# Patient Record
Sex: Female | Born: 1960 | Race: White | Hispanic: No | Marital: Married | Smoking: Never smoker
Health system: Southern US, Community
[De-identification: ages and names within clinical notes are randomized; demographics above are authoritative.]

## PROBLEM LIST (undated history)

## (undated) DIAGNOSIS — G5600 Carpal tunnel syndrome, unspecified upper limb: Secondary | ICD-10-CM

## (undated) DIAGNOSIS — Z86010 Personal history of colonic polyps: Secondary | ICD-10-CM

## (undated) DIAGNOSIS — E039 Hypothyroidism, unspecified: Secondary | ICD-10-CM

## (undated) DIAGNOSIS — M2550 Pain in unspecified joint: Secondary | ICD-10-CM

## (undated) HISTORY — DX: Personal history of colonic polyps: Z86.010

## (undated) HISTORY — DX: Carpal tunnel syndrome, unspecified upper limb: G56.00

## (undated) HISTORY — DX: Pain in unspecified joint: M25.50

## (undated) HISTORY — DX: Hypothyroidism, unspecified: E03.9

## (undated) HISTORY — DX: Other disorders of bilirubin metabolism: E80.6

---

## 1998-05-06 ENCOUNTER — Other Ambulatory Visit: Admission: RE | Admit: 1998-05-06 | Discharge: 1998-05-06 | Payer: Self-pay | Admitting: Family Medicine

## 1998-05-21 ENCOUNTER — Emergency Department (HOSPITAL_COMMUNITY): Admission: EM | Admit: 1998-05-21 | Discharge: 1998-05-21 | Payer: Self-pay | Admitting: Emergency Medicine

## 1998-05-24 ENCOUNTER — Emergency Department (HOSPITAL_COMMUNITY): Admission: EM | Admit: 1998-05-24 | Discharge: 1998-05-24 | Payer: Self-pay | Admitting: *Deleted

## 1998-05-28 ENCOUNTER — Encounter (HOSPITAL_COMMUNITY): Admission: RE | Admit: 1998-05-28 | Discharge: 1998-08-26 | Payer: Self-pay | Admitting: *Deleted

## 1998-06-04 ENCOUNTER — Emergency Department (HOSPITAL_COMMUNITY): Admission: EM | Admit: 1998-06-04 | Discharge: 1998-06-04 | Payer: Self-pay | Admitting: Emergency Medicine

## 1999-05-12 ENCOUNTER — Other Ambulatory Visit: Admission: RE | Admit: 1999-05-12 | Discharge: 1999-05-12 | Payer: Self-pay | Admitting: Obstetrics and Gynecology

## 1999-09-02 ENCOUNTER — Ambulatory Visit (HOSPITAL_BASED_OUTPATIENT_CLINIC_OR_DEPARTMENT_OTHER): Admission: RE | Admit: 1999-09-02 | Discharge: 1999-09-02 | Payer: Self-pay | Admitting: General Surgery

## 1999-09-02 ENCOUNTER — Encounter (INDEPENDENT_AMBULATORY_CARE_PROVIDER_SITE_OTHER): Payer: Self-pay | Admitting: *Deleted

## 2000-08-16 ENCOUNTER — Other Ambulatory Visit: Admission: RE | Admit: 2000-08-16 | Discharge: 2000-08-16 | Payer: Self-pay | Admitting: Obstetrics and Gynecology

## 2001-04-04 ENCOUNTER — Ambulatory Visit (HOSPITAL_COMMUNITY): Admission: RE | Admit: 2001-04-04 | Discharge: 2001-04-04 | Payer: Self-pay | Admitting: Gastroenterology

## 2001-04-04 ENCOUNTER — Encounter (INDEPENDENT_AMBULATORY_CARE_PROVIDER_SITE_OTHER): Payer: Self-pay | Admitting: Specialist

## 2001-09-07 ENCOUNTER — Other Ambulatory Visit: Admission: RE | Admit: 2001-09-07 | Discharge: 2001-09-07 | Payer: Self-pay | Admitting: Obstetrics and Gynecology

## 2002-10-04 ENCOUNTER — Other Ambulatory Visit: Admission: RE | Admit: 2002-10-04 | Discharge: 2002-10-04 | Payer: Self-pay | Admitting: Obstetrics and Gynecology

## 2003-10-24 ENCOUNTER — Other Ambulatory Visit: Admission: RE | Admit: 2003-10-24 | Discharge: 2003-10-24 | Payer: Self-pay | Admitting: Obstetrics and Gynecology

## 2004-09-02 ENCOUNTER — Encounter: Admission: RE | Admit: 2004-09-02 | Discharge: 2004-09-02 | Payer: Self-pay | Admitting: Internal Medicine

## 2004-09-03 ENCOUNTER — Ambulatory Visit: Payer: Self-pay | Admitting: Internal Medicine

## 2004-10-06 ENCOUNTER — Ambulatory Visit: Payer: Self-pay | Admitting: Internal Medicine

## 2004-10-12 ENCOUNTER — Encounter: Admission: RE | Admit: 2004-10-12 | Discharge: 2004-10-12 | Payer: Self-pay | Admitting: Internal Medicine

## 2004-11-20 ENCOUNTER — Other Ambulatory Visit: Admission: RE | Admit: 2004-11-20 | Discharge: 2004-11-20 | Payer: Self-pay | Admitting: Obstetrics and Gynecology

## 2005-02-16 IMAGING — CT CT NECK W/ CM
2 series · 10 of 14 positions shown, 12 images · IV contrast (75CC OMNI 300)
Comparison: none

CLINICAL DATA: Abnormal ultrasound of the thyroid with solid nodule in right lower lobe.
TECHNIQUE: Multidetector helical scans through the neck were performed after IV contrast media was given.  75 cc of Omnipaque 300 were given as the contrast media.   
CT OF THE NECK WITH CONTRAST:
The skull base appears normal.  The oronasopharynx appears normal.  No adenopathy is seen.  The aryepiglottic folds appear normal.  However, there is some flattening of the right true cord with increased opacity, and a lesion of the right true cord cannot be excluded.  The subglottic airway appears normal.  There is some inhomogeneity to the thyroid which may represent small nodule noted on recent ultrasound of the thyroid.  No adjacent adenopathy is seen.  The lung apices appear clear.

[Series 2: neck · axial · 0.39mm/px · z∈[-9,+171]mm · 8 of 62 slices shown, 10 images]
[im 7/62  soft-tissue]
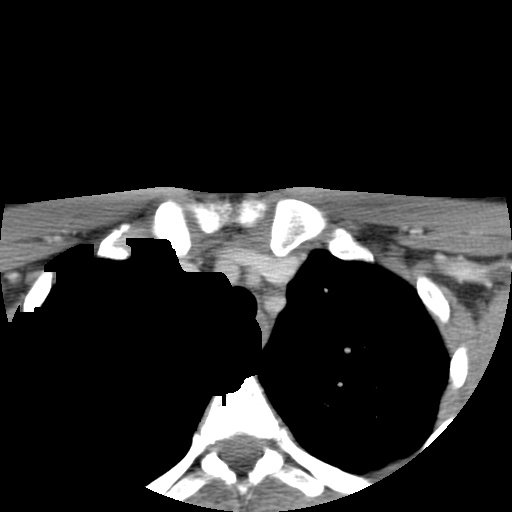
[im 7/62  bone]
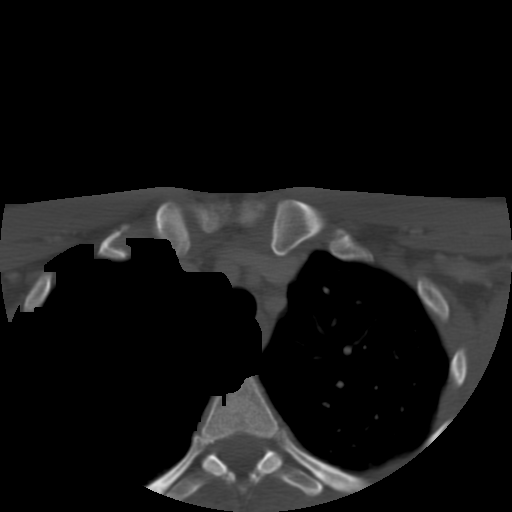
[im 14/62  bone]
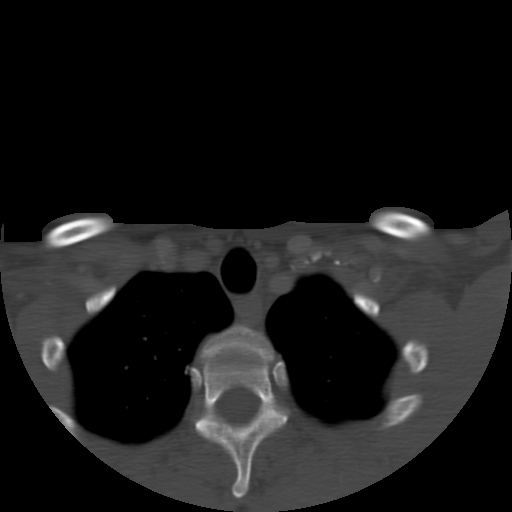
[im 21/62  bone]
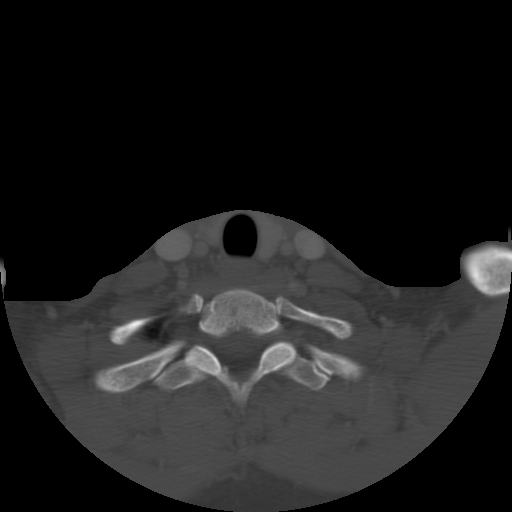
[im 28/62  bone]
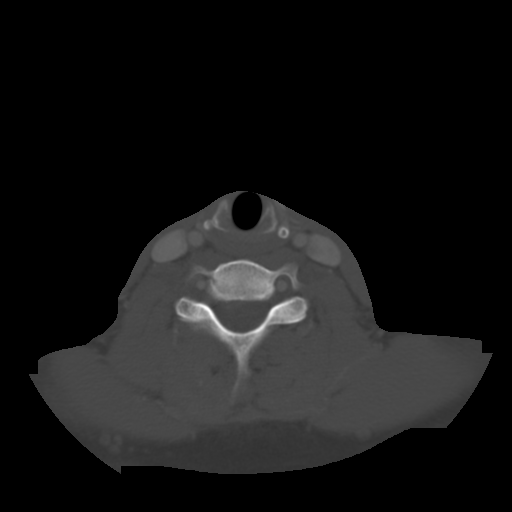
[im 34/62  soft-tissue]
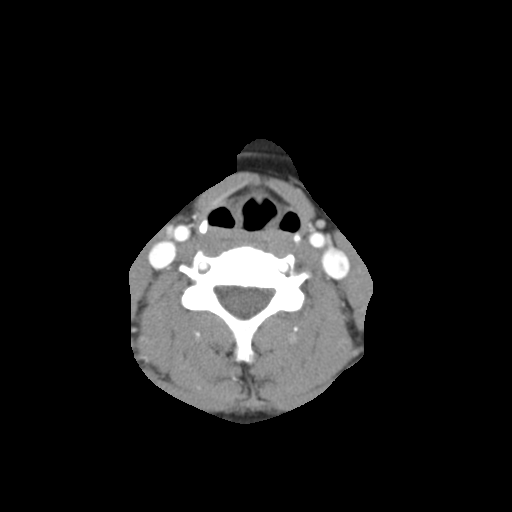
[im 34/62  bone]
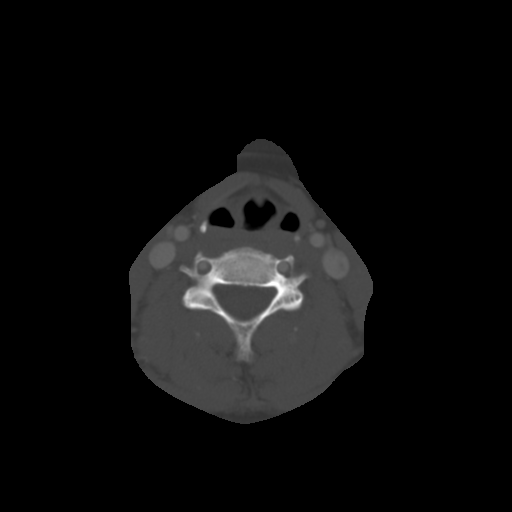
[im 41/62  bone]
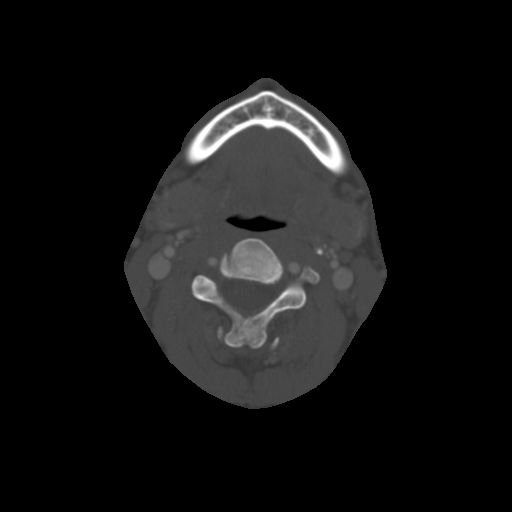
[im 48/62  bone]
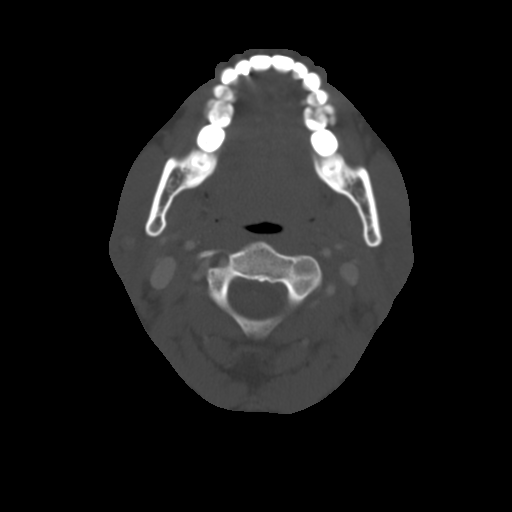
[im 55/62  bone]
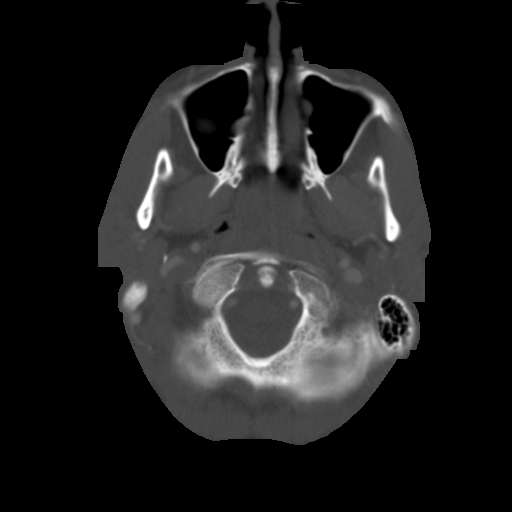

[Series 3: recon 2: neck · axial · 0.52mm/px · z∈[-9,+18]mm · 2 of 21 slices shown]
[im 7/21  bone]
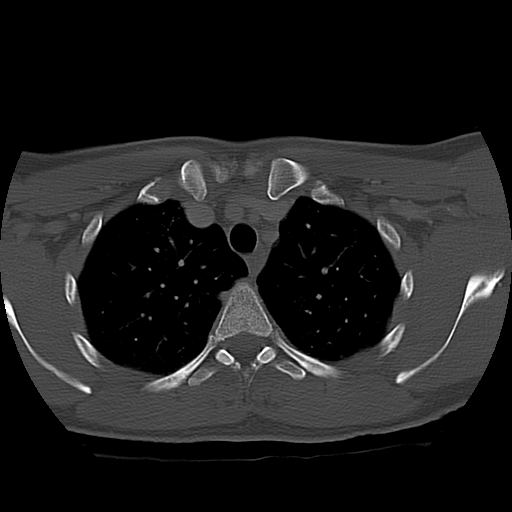
[im 14/21  bone]
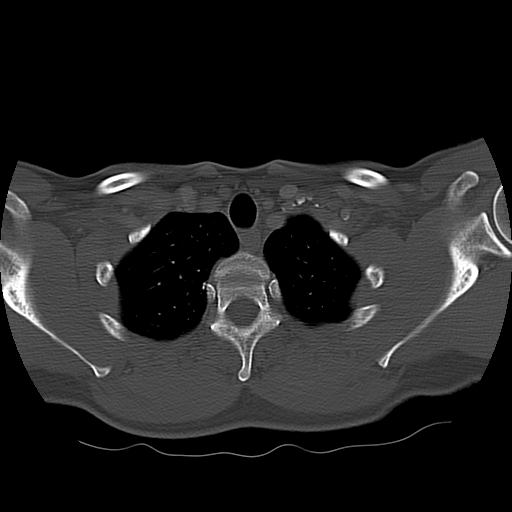

[10 of 14 positions shown; findings below may reference images not displayed]

IMPRESSION: 1.    Increased soft tissue in region of right vocal cord with flattening, possibly due to vocal cord paralysis.  Cannot exclude neoplasm.  Suggest direct visualization.
2.  No other evidence of mass or adenopathy.

## 2005-05-12 ENCOUNTER — Ambulatory Visit: Payer: Self-pay | Admitting: Internal Medicine

## 2005-12-07 ENCOUNTER — Other Ambulatory Visit: Admission: RE | Admit: 2005-12-07 | Discharge: 2005-12-07 | Payer: Self-pay | Admitting: Obstetrics and Gynecology

## 2006-08-18 ENCOUNTER — Ambulatory Visit: Payer: Self-pay | Admitting: Internal Medicine

## 2006-08-18 LAB — CONVERTED CEMR LAB
Chol/HDL Ratio, serum: 2.5
Cholesterol: 179 mg/dL (ref 0–200)
HDL: 72 mg/dL (ref >39.0)
LDL Cholesterol: 78 mg/dL (ref 0–99)
TSH: 9.99 microintl units/mL — ABNORMAL HIGH (ref 0.35–5.50)
Triglyceride fasting, serum: 146 mg/dL (ref 0–149)
VLDL: 29 mg/dL (ref 0–40)

## 2006-10-31 ENCOUNTER — Encounter: Admission: RE | Admit: 2006-10-31 | Discharge: 2006-10-31 | Payer: Self-pay | Admitting: Internal Medicine

## 2007-02-19 LAB — CONVERTED CEMR LAB: Pap Smear: NORMAL

## 2007-03-07 IMAGING — US US SOFT TISSUE HEAD/NECK
1 series · 14 of 25 positions shown · non-contrast
Comparison: 09/02/04.

CLINICAL DATA: Thyroid nodule.
 THYROID ULTRASOUND:
TECHNIQUE: Ultrasound examination of the thyroid gland and adjacent soft tissue structures was performed.

[Series 1: unknown · 0.07mm/px · 14 of 46 slices shown]
[im 1/46]
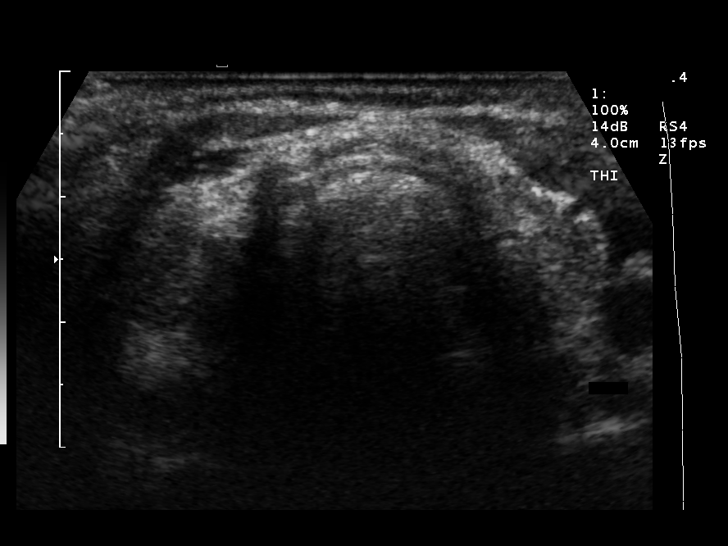
[im 4/46]
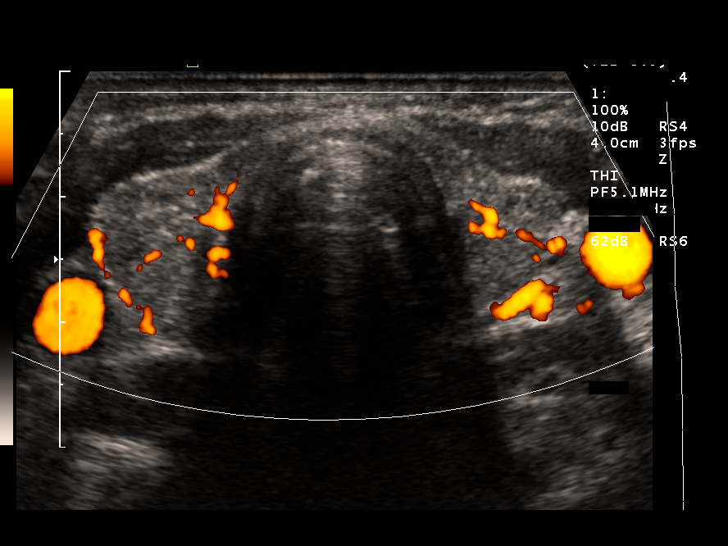
[im 8/46]
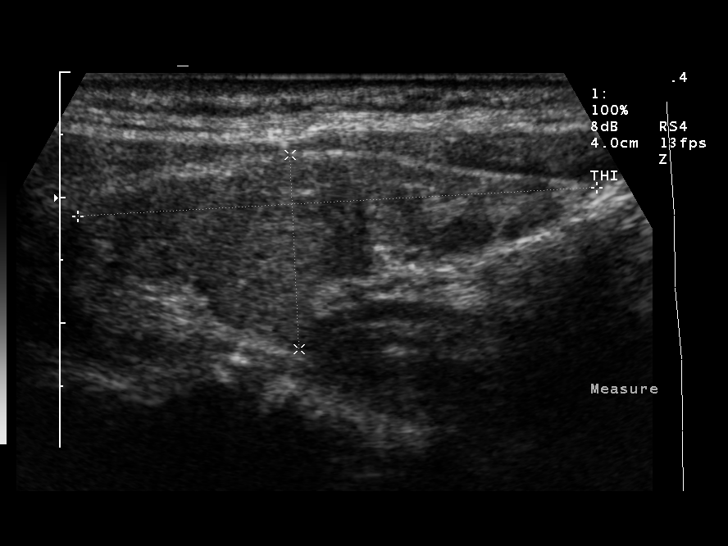
[im 12/46]
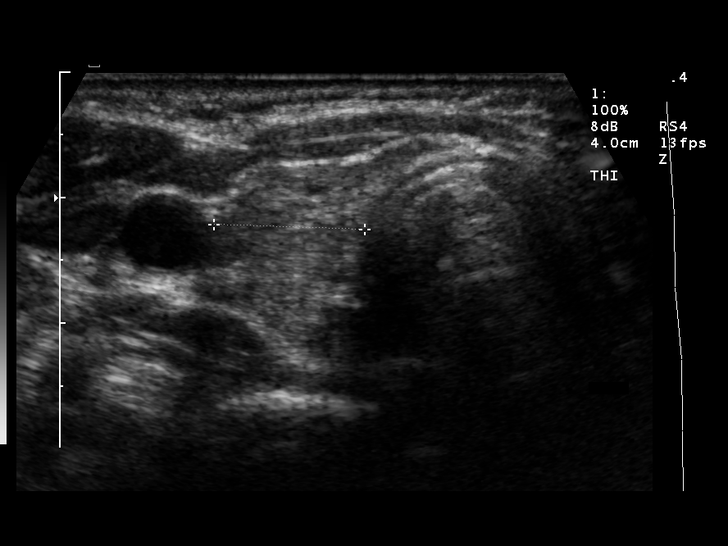
[im 16/46]
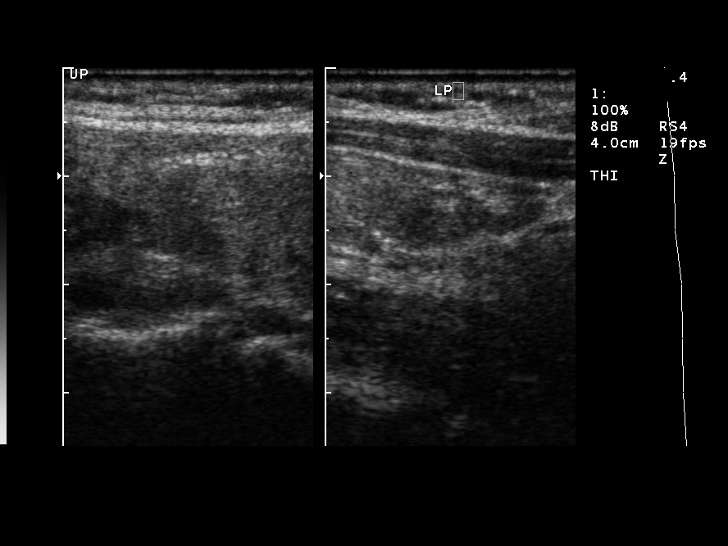
[im 17/46]
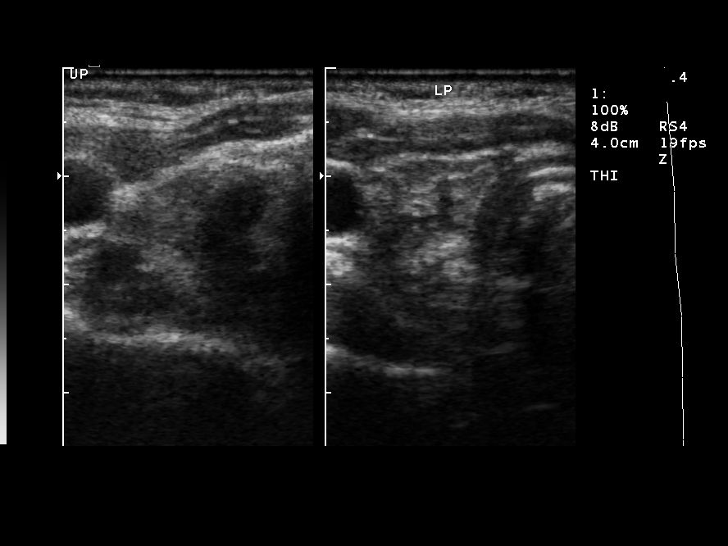
[im 21/46]
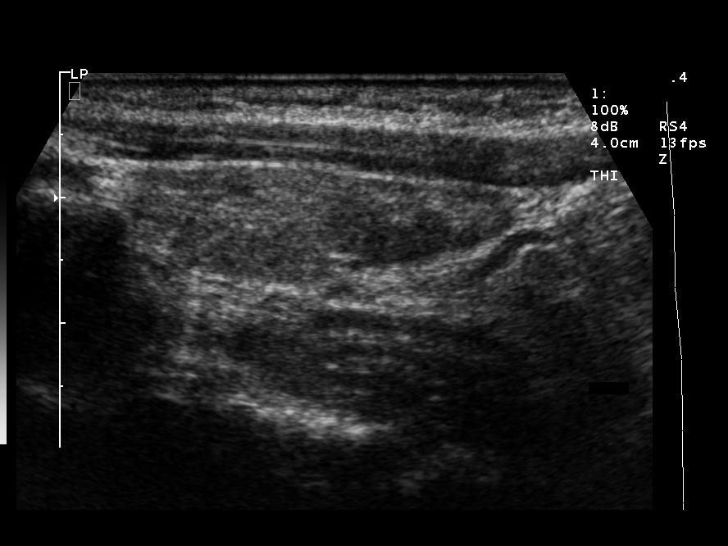
[im 25/46]
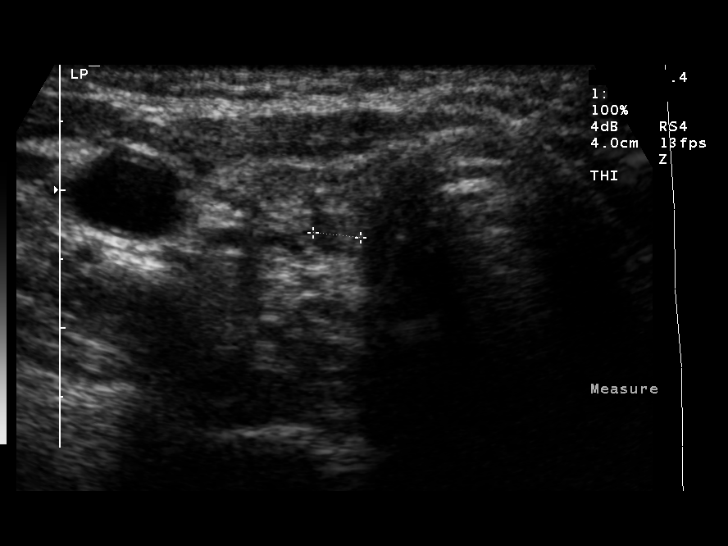
[im 29/46]
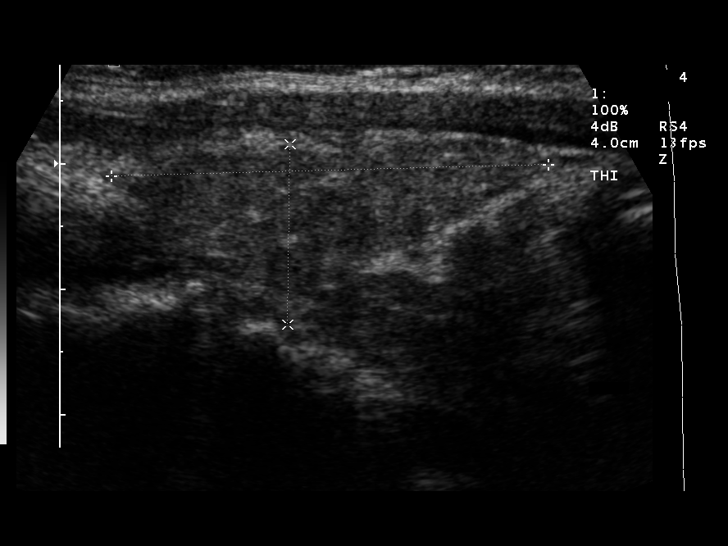
[im 31/46]
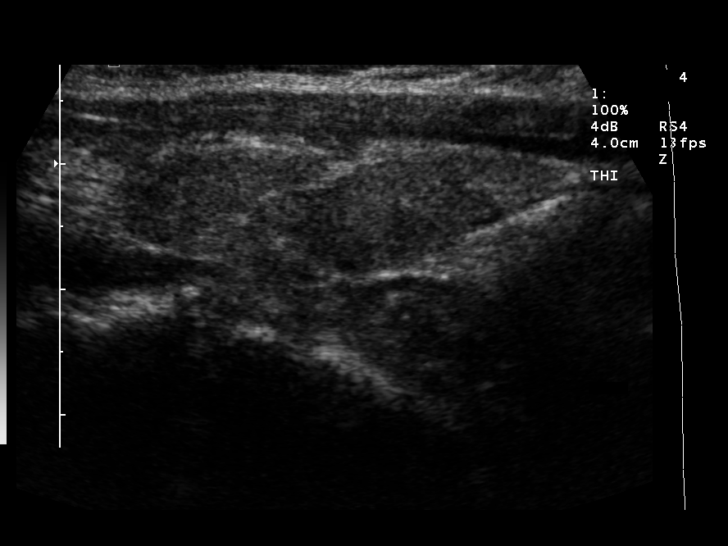
[im 34/46]
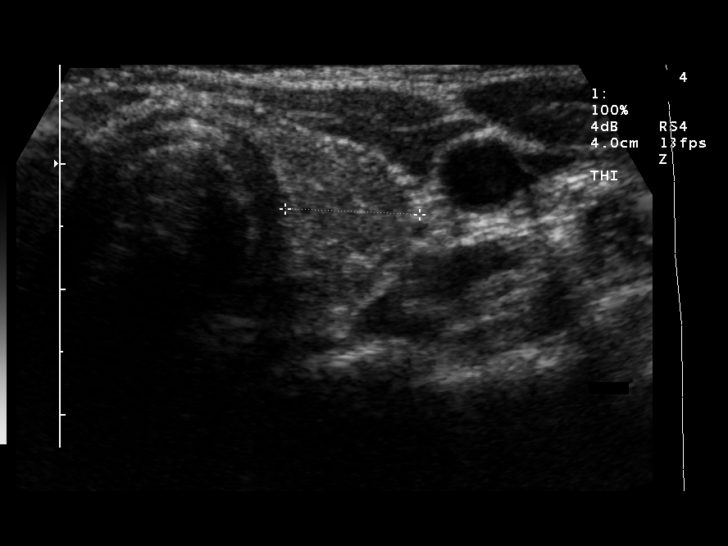
[im 38/46]
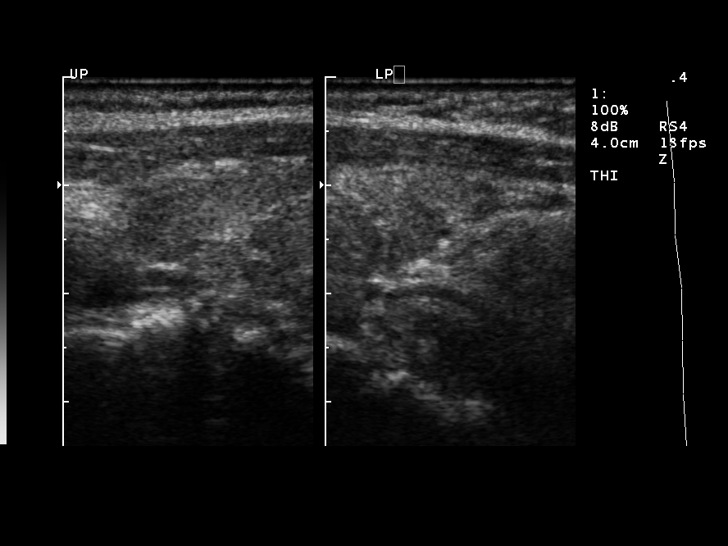
[im 42/46]
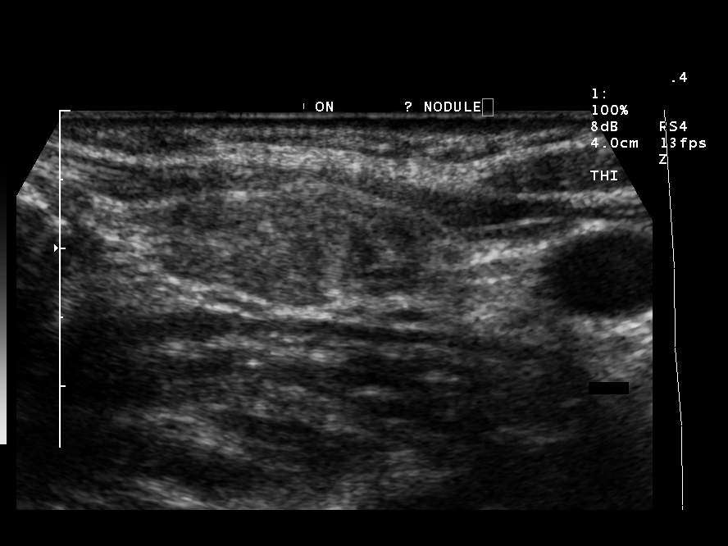
[im 46/46]
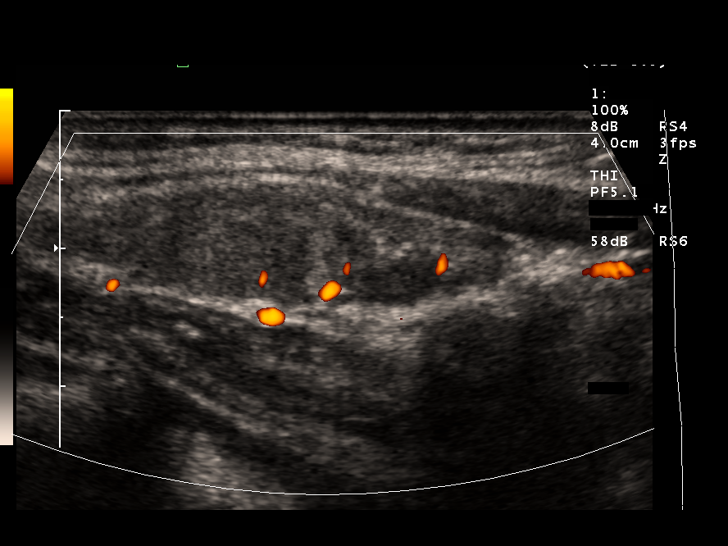

[14 of 25 positions shown; findings below may reference images not displayed]

FINDINGS: Compared to the ultrasound of 09/02/04, the thyroid gland has not changed significantly in size.  The right lobe measures 4.4 cm sagittally with a depth of 1.7 cm and width of 1.2 cm.   The left lobe measures 3.8 x 1.3 x 1.1 cm with the isthmus measuring 3 mm.  The gland is slightly inhomogeneous.  Only two nodules are noted in the lower aspect of the right lobe.   The larger measures 8 x 5 x 3 mm with the slightly more cephalad nodule measuring 5 x 6 x 5 mm.  No left lobe nodule is seen.
IMPRESSION: Only two small solid nodules are noted in the lower pole of the right lobe.   The gland has not changed in size.

## 2007-07-04 DIAGNOSIS — E039 Hypothyroidism, unspecified: Secondary | ICD-10-CM

## 2007-07-04 DIAGNOSIS — Z860100 Personal history of colon polyps, unspecified: Secondary | ICD-10-CM | POA: Insufficient documentation

## 2007-07-04 HISTORY — DX: Personal history of colonic polyps: Z86.010

## 2007-07-04 HISTORY — DX: Personal history of colon polyps, unspecified: Z86.0100

## 2007-07-04 HISTORY — DX: Hypothyroidism, unspecified: E03.9

## 2007-07-04 HISTORY — DX: Other disorders of bilirubin metabolism: E80.6

## 2007-09-04 ENCOUNTER — Telehealth: Payer: Self-pay | Admitting: Internal Medicine

## 2007-09-18 ENCOUNTER — Telehealth: Payer: Self-pay | Admitting: Internal Medicine

## 2007-09-25 ENCOUNTER — Ambulatory Visit: Payer: Self-pay | Admitting: Internal Medicine

## 2007-09-27 LAB — CONVERTED CEMR LAB
ALT: 18 units/L (ref 0–35)
AST: 24 units/L (ref 0–37)
Albumin: 4 g/dL (ref 3.5–5.2)
Alkaline Phosphatase: 23 units/L — ABNORMAL LOW (ref 39–117)
BUN: 17 mg/dL (ref 6–23)
Bilirubin, Direct: 0.3 mg/dL (ref 0.0–0.3)
CO2: 28 meq/L (ref 19–32)
Calcium: 9.6 mg/dL (ref 8.4–10.5)
Chloride: 107 meq/L (ref 96–112)
Cholesterol: 172 mg/dL (ref 0–200)
Creatinine, Ser: 1.3 mg/dL — ABNORMAL HIGH (ref 0.4–1.2)
GFR calc Af Amer: 56 mL/min
GFR calc non Af Amer: 46 mL/min
Glucose, Bld: 85 mg/dL (ref 70–99)
HDL: 83.8 mg/dL (ref >39.0)
LDL Cholesterol: 64 mg/dL (ref 0–99)
Potassium: 5.3 meq/L — ABNORMAL HIGH (ref 3.5–5.1)
Sodium: 141 meq/L (ref 135–145)
TSH: 6.07 microintl units/mL — ABNORMAL HIGH (ref 0.35–5.50)
Total Bilirubin: 1 mg/dL (ref 0.3–1.2)
Total CHOL/HDL Ratio: 2.1
Total Protein: 6.9 g/dL (ref 6.0–8.3)
Triglycerides: 123 mg/dL (ref 0–149)
VLDL: 25 mg/dL (ref 0–40)

## 2007-12-08 ENCOUNTER — Ambulatory Visit: Payer: Self-pay | Admitting: Internal Medicine

## 2007-12-11 LAB — CONVERTED CEMR LAB: TSH: 3.11 microintl units/mL (ref 0.35–5.50)

## 2008-12-13 ENCOUNTER — Ambulatory Visit: Payer: Self-pay | Admitting: Family Medicine

## 2008-12-13 DIAGNOSIS — R209 Unspecified disturbances of skin sensation: Secondary | ICD-10-CM

## 2008-12-16 LAB — CONVERTED CEMR LAB: TSH: 2.91 microintl units/mL (ref 0.35–5.50)

## 2009-06-09 ENCOUNTER — Encounter: Payer: Self-pay | Admitting: Internal Medicine

## 2009-06-10 ENCOUNTER — Telehealth: Payer: Self-pay | Admitting: Internal Medicine

## 2009-09-26 ENCOUNTER — Ambulatory Visit: Payer: Self-pay | Admitting: Internal Medicine

## 2009-09-26 DIAGNOSIS — G5600 Carpal tunnel syndrome, unspecified upper limb: Secondary | ICD-10-CM | POA: Insufficient documentation

## 2009-09-26 DIAGNOSIS — M2550 Pain in unspecified joint: Secondary | ICD-10-CM

## 2009-09-26 HISTORY — DX: Pain in unspecified joint: M25.50

## 2009-09-26 HISTORY — DX: Carpal tunnel syndrome, unspecified upper limb: G56.00

## 2009-10-01 LAB — CONVERTED CEMR LAB: CRP, High Sensitivity: 0.6 (ref 0.00–5.00)

## 2010-10-10 ENCOUNTER — Encounter: Payer: Self-pay | Admitting: Internal Medicine

## 2010-10-22 NOTE — Assessment & Plan Note (Signed)
Summary: joint and hand pain with numbness/dm   Vital Signs:  Patient profile:   50 year old female Weight:      144 pounds Temp:     98.6 degrees F Pulse rate:   76 / minute Resp:     12 per minute BP sitting:   106 / 64  (left arm)  Vitals Entered By: Gladis Riffle, RN (September 26, 2009 10:42 AM)   History of Present Illness: hand pain and tingling typically occurs at night but can be daytime duration --months no aggravating factors    Preventive Screening-Counseling & Management  Alcohol-Tobacco     Smoking Status: never  Allergies (verified): No Known Drug Allergies  Comments:  Nurse/Medical Assistant: c/o hand "falling asleep" when sleeps at night, does not completely resolve durong day ; began around Thanksgiving  The patient's medications and allergies were reviewed with the patient and were updated in the Medication and Allergy Lists. Gladis Riffle, RN (September 26, 2009 10:44 AM)  Review of Systems       All other systems reviewed and were negative   Physical Exam  General:  patient alert healthy appearance Head:  normocephalic and atraumatic.   Neurologic:  + tinnels   Impression & Recommendations:  Problem # 1:  CARPAL TUNNEL SYNDROME (ICD-354.0) by hx trial wrist brace call if sxs persist  Problem # 2:  ARTHRALGIA (ICD-719.40) has multiple MSK complaints no meds will try to R/O inflammatory arthritis Orders: Venipuncture (16109) Sedimentation Rate, non-automated (60454) TLB-CRP-High Sensitivity (C-Reactive Protein) (86140-FCRP)  Complete Medication List: 1)  Synthroid 112 Mcg Tabs (Levothyroxine sodium) .... Take 1 tablet by mouth once a day 2)  Claritin 10 Mg Tabs (Loratadine) .... Otc-once daily as needed   Laboratory Results   Blood Tests     SED rate: 8 mm/hr  Comments: Joanne Chars CMA  September 26, 2009 2:17 PM

## 2010-11-11 ENCOUNTER — Encounter: Payer: Self-pay | Admitting: Family Medicine

## 2010-11-11 ENCOUNTER — Ambulatory Visit (INDEPENDENT_AMBULATORY_CARE_PROVIDER_SITE_OTHER): Payer: BC Managed Care – PPO | Admitting: Family Medicine

## 2010-11-11 VITALS — BP 110/70 | Temp 98.5°F | Ht 65.5 in | Wt 146.0 lb

## 2010-11-11 DIAGNOSIS — J069 Acute upper respiratory infection, unspecified: Secondary | ICD-10-CM

## 2010-11-11 MED ORDER — AZITHROMYCIN 250 MG PO TABS: ORAL_TABLET | ORAL | Status: AC

## 2010-11-11 NOTE — Progress Notes (Signed)
  Subjective:    Patient ID: Tammy Moreno, female    DOB: February 25, 1961, 50 y.o.   MRN: [id number]  HPI  patient seen with nasal congestion, progressive sinus pressure, intermittent headache, and bilateral earache for the past several days. Onset about 5 days ago. Motrin with mild relief. DayQuil without much relief. Patient is nonsmoker. History of frequent sinusitis in the past.   Review of Systems Denies fever, prolonged cough, sore throat.    Objective:   Physical Exam  patient is alert nontoxic in appearance Afebrile  Nasal mucosa unremarkable with clear mucus Eardrums normal but not fully visualized secondary to cerumen Oropharynx is moist and clear Neck supple no adenopathy Chest clear to auscultation       Assessment & Plan:   probable viral URI. We did not recommend any antibiotics this time. She will start Zithromax only if she develops any fever, progressive facial pain or other indicators of bacterial sinusitis.

## 2011-01-04 ENCOUNTER — Telehealth: Payer: Self-pay | Admitting: Internal Medicine

## 2011-01-04 ENCOUNTER — Other Ambulatory Visit: Payer: Self-pay | Admitting: Internal Medicine

## 2011-01-04 DIAGNOSIS — E039 Hypothyroidism, unspecified: Secondary | ICD-10-CM

## 2011-01-04 NOTE — Telephone Encounter (Signed)
Schedule TSH diagnostic code 244.9

## 2011-01-04 NOTE — Telephone Encounter (Signed)
A Dr Fabian Sharp pt

## 2011-01-04 NOTE — Telephone Encounter (Signed)
A Dr Panosh pt 

## 2011-01-04 NOTE — Telephone Encounter (Signed)
Pt is on synthoid med requesting blood work. Can I sch?

## 2011-01-05 NOTE — Telephone Encounter (Signed)
This is a [facility] Pt, not Dr. Fabian Sharp.

## 2011-01-06 NOTE — Telephone Encounter (Signed)
lmom for pt to callback to sch °

## 2011-01-07 NOTE — Telephone Encounter (Signed)
appt made for 01-12-2011.

## 2011-01-12 ENCOUNTER — Other Ambulatory Visit (INDEPENDENT_AMBULATORY_CARE_PROVIDER_SITE_OTHER): Payer: BC Managed Care – PPO | Admitting: Internal Medicine

## 2011-01-12 DIAGNOSIS — E039 Hypothyroidism, unspecified: Secondary | ICD-10-CM

## 2011-01-12 LAB — TSH: TSH: 0.37 u[IU]/mL (ref 0.35–5.50)

## 2011-01-15 ENCOUNTER — Telehealth: Payer: Self-pay | Admitting: *Deleted

## 2011-01-15 NOTE — Telephone Encounter (Signed)
ERROR

## 2011-01-18 ENCOUNTER — Other Ambulatory Visit: Payer: Self-pay | Admitting: *Deleted

## 2011-01-18 DIAGNOSIS — E039 Hypothyroidism, unspecified: Secondary | ICD-10-CM

## 2011-01-18 MED ORDER — SYNTHROID 112 MCG PO TABS: 112.0000 ug | ORAL_TABLET | Freq: Every day | ORAL | Status: AC

## 2011-02-05 NOTE — Op Note (Signed)
[facility]  Patient:    Tammy Moreno                     MRN: [id number] Proc. Date: 09/02/99 Adm. Date:  [id number] Attending:  Arlis Porta                           Operative Report  PREOPERATIVE DIAGNOSIS:  Left breast mass.  POSTOPERATIVE DIAGNOSIS:  Left breast mass.  PROCEDURE:  Left breast biopsy.  SURGEON:  Adolph Pollack, M.D.  ANESTHESIA:  Local (1% lidocaine with epinephrine plus 0.5% plain Marcaine plus  sodium bicarbonate) with MAC.  INDICATIONS:  This 50 year old female found a left breast mass which was solid n ultrasound.  It is very evident on examination and she now presents for biopsy.  TECHNIQUE:  She is placed supine on the operating table.  The breast mass was marked, then she was given intravenous sedation.  Her left breast was sterilely  prepped and draped.  Local anesthetic was infiltrated in a curvilinear fashion nd a curvilinear incision was made directly over the mass, incising the skin and subcutaneous tissue.  Small flaps were raised in each direction, the mass was grasped with Allis forceps and it was excised sharply.  It was sent to pathology fresh.  The wound was then inspected and areas of bleeding were controlled with cautery and with interrupted Vicryl sutures.  Once the wound bed was dry, the subcutaneous at was loosely approximated with interrupted 3-0 Vicryl sutures and the skin closed with a running 4-0 Monocryl subcuticular stitch.  Steri-Strips and sterile dressing were then placed on the wound.  She tolerated the procedure well without any apparent complications and was taken to the recovery room in satisfactory condition. DD:  09/02/99 TD:  09/03/99 Job: [id number]

## 2011-02-16 ENCOUNTER — Telehealth: Payer: Self-pay | Admitting: *Deleted

## 2011-02-16 DIAGNOSIS — E079 Disorder of thyroid, unspecified: Secondary | ICD-10-CM

## 2011-02-16 NOTE — Telephone Encounter (Signed)
Ok...she can call for appt, we can do insurance referral

## 2011-02-16 NOTE — Telephone Encounter (Signed)
Pt would like a referral to Dr. Leslie Dales for her thyroid disease, please.

## 2012-05-24 ENCOUNTER — Ambulatory Visit (INDEPENDENT_AMBULATORY_CARE_PROVIDER_SITE_OTHER): Payer: BC Managed Care – PPO | Admitting: Sports Medicine

## 2012-05-24 VITALS — BP 120/70 | Ht 65.0 in | Wt 138.0 lb

## 2012-05-24 DIAGNOSIS — IMO0002 Reserved for concepts with insufficient information to code with codable children: Secondary | ICD-10-CM

## 2012-05-24 DIAGNOSIS — M76899 Other specified enthesopathies of unspecified lower limb, excluding foot: Secondary | ICD-10-CM

## 2012-05-24 DIAGNOSIS — M25569 Pain in unspecified knee: Secondary | ICD-10-CM | POA: Insufficient documentation

## 2012-05-24 DIAGNOSIS — M21619 Bunion of unspecified foot: Secondary | ICD-10-CM | POA: Insufficient documentation

## 2012-05-24 DIAGNOSIS — S83249A Other tear of medial meniscus, current injury, unspecified knee, initial encounter: Secondary | ICD-10-CM | POA: Insufficient documentation

## 2012-05-24 DIAGNOSIS — M7050 Other bursitis of knee, unspecified knee: Secondary | ICD-10-CM

## 2012-05-24 MED ORDER — MELOXICAM 15 MG PO TABS: 15.0000 mg | ORAL_TABLET | Freq: Every day | ORAL | Status: AC

## 2012-05-24 NOTE — Assessment & Plan Note (Signed)
Likely given some of patient's knee pain as well. Patient will be treated with anti-inflammatories for 2 weeks as well as compression. Patient will come back in 6 weeks' time. At that time we should consider rescanning to make sure that this bursitis is improving.

## 2012-05-24 NOTE — Assessment & Plan Note (Signed)
Patient clinically as well as on ultrasound show signs of a medial meniscal tear. Patient of course was to avoid any surgery and is able to do all activities of daily living without much impedance. At this time with the compression as well as anti-inflammatories for 2 weeks. Patient given a home exercise program to do a regular basis. Patient will followup in 6 weeks for further evaluation. If she still is having pain at that time would then consider doing an injection. 

## 2012-05-24 NOTE — Assessment & Plan Note (Signed)
Spenco orthotics suggested.  If start to give pain then will re-evaluate.

## 2012-05-24 NOTE — Assessment & Plan Note (Signed)
Patient clinically as well as on ultrasound show signs of a medial meniscal tear. Patient of course was to avoid any surgery and is able to do all activities of daily living without much impedance. At this time with the compression as well as anti-inflammatories for 2 weeks. Patient given a home exercise program to do a regular basis. Patient will followup in 6 weeks for further evaluation. If she still is having pain at that time would then consider doing an injection.

## 2012-05-24 NOTE — Progress Notes (Signed)
51 year old female coming in with right knee pain. Patient states that this has been a gradual onset over the course of last year. Patient does have a past medical history significant for a anterior cruciate ligament reconstruction done in 2006. Patient states that she did not have any meniscal tear at this time. Patient states recently this pain in the right knee has been more chronic. Patient states that the pain is mostly in the right side bending as well as some posterior pain. Patient denies any specific injury that caused her. Patient does notice that certain things such as squats, jumping, and going down stairs seem to aggravate it more. Patient states that she does have swelling on the knee usually on the superior medial aspect of the knee as well as the posterior aspect. Patient denies any locking, clicking, or giving out on her. Patient denies any fevers or chills or any numbness or weakness in the extremity.   Review of systems as stated in history of present illness otherwise unremarkable.  Past Medical History  Diagnosis Date  . ARTHRALGIA 09/26/2009  . Carpal tunnel syndrome 09/26/2009  . COLONIC POLYPS, HX OF 07/04/2007  . GILBERT'S SYNDROME 07/04/2007  . HYPOTHYROIDISM 07/04/2007    Patient did have an anterior cruciate ligament reconstruction of the right knee in 2006 by Dr. Lajoyce Corners  History  Substance Use Topics  . Smoking status: Never Smoker   . Smokeless tobacco: Not on file  . Alcohol Use: Not on file    Family history is unremarkable.  Physical exam: Filed Vitals:   05/24/12 0835  BP: 120/70   general: No apparent distress alert and oriented x3 mood and affect are normal. Respiratory: Patient speaks in full sentences and does not appear short of breath. Patient has had no edema distally. Patient does have neurovascularly intact and 2+ DTRs in all extremities and symmetric. Knee:right Normal to inspection with no erythema or effusion or obvious bony  abnormalities. Palpation pain over the medial joint line otherwise unremarkable. Patient does have some mild effusion on the posterior fossa just medial to the area. ROM normal in flexion and extension and lower leg rotation. Ligaments with solid consistent endpoints including ACL, PCL, LCL, MCL. Negative Mcmurray's but + provocative meniscal tests. Non painful patellar compression. Patellar and quadriceps tendons unremarkable. Hamstring and quadriceps strength is normal. Foot exam does show that patient does have a left-sided great toe medial bunion forming. Patient also has loss of her transverse arch bilaterally.  Ultrasound was performed and interpreted by me today. Patient does have a medial meniscal tear mostly in the anterior half of joint line but does extend to mid joint. Patient does have some hypoechoic changes surrounding this area. Patient also has a semimembranous bursitis demonstrated by marked hypoechoic change surrounding gastroc and SM tendons.  Patient does have some minimal hypoechoic changes of the supra-patellar pouch. Rest of exam was unremarkable.

## 2012-05-24 NOTE — Patient Instructions (Addendum)
Very nice to meet you You have a small meniscus tear. You also have a little bursitis.  We will call you when we get the right sleeve. Wear the sleeve with activity and 2 hours after activity.   I am giving meloxicam.  Take it daily for the next 2 weeks then as needed thereafter. I am giving you some exercises but want you to avoid squats or any motion greater than 45 degree bend in knee.   I want you to come back in 6 weeks to make sure you are doing better.

## 2013-06-18 ENCOUNTER — Telehealth: Payer: Self-pay | Admitting: Internal Medicine

## 2013-06-18 NOTE — Telephone Encounter (Signed)
Patient Information:  Caller Name: Shaniah  Phone: [phone]  Patient: Tammy Moreno, Tammy Moreno  Gender: Female  DOB: [birth year]  Age: 52 Years  PCP: Birdie Sons (Adults only)  Pregnant: No  Office Follow Up:  Does the office need to follow up with this patient?: No  Instructions For The Office: N/A   Symptoms  Reason For Call & Symptoms: Pt is calling to find out if we have records of her getting HEP b as an immunization. RN checked the [facility] and EPIC. RN did not see any Hep B was given.  Reviewed Health History In EMR: Yes  Reviewed Medications In EMR: Yes  Reviewed Allergies In EMR: Yes  Reviewed Surgeries / Procedures: Yes  Date of Onset of Symptoms: 06/18/2013 OB / GYN:  LMP: Unknown  Guideline(s) Used:  No Protocol Available - Information Only  Disposition Per Guideline:   Home Care  Reason For Disposition Reached:   Information only question and nurse able to answer  Advice Given:  N/A  Patient Will Follow Care Advice:  YES

## 2013-06-25 ENCOUNTER — Telehealth: Payer: Self-pay | Admitting: *Deleted

## 2013-06-25 NOTE — Telephone Encounter (Signed)
Pt request 09/25/2013 for surgery with Dr. Charlsie Merles and has appt for consultation 07/16/2013.

## 2013-07-12 ENCOUNTER — Ambulatory Visit (INDEPENDENT_AMBULATORY_CARE_PROVIDER_SITE_OTHER): Payer: BC Managed Care – HMO | Admitting: Psychology

## 2013-07-12 DIAGNOSIS — F4323 Adjustment disorder with mixed anxiety and depressed mood: Secondary | ICD-10-CM

## 2013-07-16 ENCOUNTER — Encounter: Payer: Self-pay | Admitting: Podiatry

## 2013-07-16 ENCOUNTER — Ambulatory Visit (INDEPENDENT_AMBULATORY_CARE_PROVIDER_SITE_OTHER): Payer: BC Managed Care – PPO | Admitting: Podiatry

## 2013-07-16 ENCOUNTER — Institutional Professional Consult (permissible substitution): Payer: Self-pay | Admitting: Podiatry

## 2013-07-16 VITALS — BP 118/79 | HR 65 | Resp 16 | Ht 65.0 in | Wt 135.0 lb

## 2013-07-16 DIAGNOSIS — M2010 Hallux valgus (acquired), unspecified foot: Secondary | ICD-10-CM

## 2013-07-16 DIAGNOSIS — M2040 Other hammer toe(s) (acquired), unspecified foot: Secondary | ICD-10-CM

## 2013-07-16 NOTE — Patient Instructions (Signed)
Pre-Operative Instructions  Congratulations, you have decided to take an important step to improving your quality of life.  You can be assured that the doctors of [facility] will be with you every step of the way.  1. Plan to be at the surgery center/hospital at least 1 (one) hour prior to your scheduled time unless otherwise directed by the surgical center/hospital staff.  You must have a responsible adult accompany you, remain during the surgery and drive you home.  Make sure you have directions to the surgical center/hospital and know how to get there on time. 2. For hospital based surgery you will need to obtain a history and physical form from your family physician within 1 month prior to the date of surgery- we will give you a form for you primary physician.  3. We make every effort to accommodate the date you request for surgery.  There are however, times where surgery dates or times have to be moved.  We will contact you as soon as possible if a change in schedule is required.   4. No Aspirin/Ibuprofen for one week before surgery.  If you are on aspirin, any non-steroidal anti-inflammatory medications (Mobic, Aleve, Ibuprofen) you should stop taking it 7 days prior to your surgery.  You make take Tylenol  For pain prior to surgery.  5. Medications- If you are taking daily heart and blood pressure medications, seizure, reflux, allergy, asthma, anxiety, pain or diabetes medications, make sure the surgery center/hospital is aware before the day of surgery so they may notify you which medications to take or avoid the day of surgery. 6. No food or drink after midnight the night before surgery unless directed otherwise by surgical center/hospital staff. 7. No alcoholic beverages 24 hours prior to surgery.  No smoking 24 hours prior to or 24 hours after surgery. 8. Wear loose pants or shorts- loose enough to fit over bandages, boots, and casts. 9. No slip on shoes, sneakers are best. 10. Bring  your boot with you to the surgery center/hospital.  Also bring crutches or a walker if your physician has prescribed it for you.  If you do not have this equipment, it will be provided for you after surgery. 11. If you have not been contracted by the surgery center/hospital by the day before your surgery, call to confirm the date and time of your surgery. 12. Leave-time from work may vary depending on the type of surgery you have.  Appropriate arrangements should be made prior to surgery with your employer. 13. Prescriptions will be provided immediately following surgery by your doctor.  Have these filled as soon as possible after surgery and take the medication as directed. 14. Remove nail polish on the operative foot. 15. Wash the night before surgery.  The night before surgery wash the foot and leg well with the antibacterial soap provided and water paying special attention to beneath the toenails and in between the toes.  Rinse thoroughly with water and dry well with a towel.  Perform this wash unless told not to do so by your physician.  Enclosed: 1 Ice pack (please put in freezer the night before surgery)   1 Hibiclens skin cleaner   Pre-op Instructions  If you have any questions regarding the instructions, do not hesitate to call our office.  [place]: [address] [place], [place] [address] [phone]  [place]: [address] , [place] [address] [phone]  [place]: [address]  [place], [place] [address] [phone]  Dr. Richard   Tuchman DPM, Dr. Norman Regal DPM Dr. Richard Sikora DPM, Dr. M. Todd Hyatt DPM, Dr. Kathryn Egerton DPM 

## 2013-07-17 NOTE — Progress Notes (Signed)
Subjective:     Patient ID: Tammy Moreno, female   DOB: 1961-04-07, 52 y.o.   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       Objective:   Physical Exam  Nursing note and vitals reviewed. Constitutional: She appears well-developed and well-nourished.  Cardiovascular: Intact distal pulses.   Musculoskeletal: Normal range of motion.  Neurological: She is alert.  Skin: Skin is warm.   Patient has structural bunion deformity left over right with redness and pain when palpated. 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 Explained all possible complications as outlined and the procedures that will be performed and discussed a total recovery. Will take approximately 6 months to one year for recovery. Patient was dispensed air fracture walker with instructions on usage and is scheduled for surgery in approximately 8 weeks and is encouraged to call with any questions prior to surgery. She is given all preoperative instructions today

## 2013-09-25 ENCOUNTER — Encounter: Payer: Self-pay | Admitting: Podiatry

## 2013-09-25 DIAGNOSIS — M2010 Hallux valgus (acquired), unspecified foot: Secondary | ICD-10-CM

## 2013-09-25 DIAGNOSIS — M2040 Other hammer toe(s) (acquired), unspecified foot: Secondary | ICD-10-CM

## 2013-09-26 ENCOUNTER — Telehealth: Payer: Self-pay | Admitting: *Deleted

## 2013-09-26 NOTE — Telephone Encounter (Signed)
Pt states had surgery 09/25/2013, has a question about her pain medicine.  Pt states she felt so good this morning she cut her pain medication to one instead of two, and about 200pm her foot began to hurt.  She states she remembers Dr Charlsie Merlesegal saying she could take Ibuprofen 200mg  two, but she didn't remember how many times a day.  I told pt she could go back on two pain pills as ordered and take Ibuprofen 200mg  four times a day.  Pt states understanding, and she has no other problems when asked.

## 2013-09-27 ENCOUNTER — Telehealth: Payer: Self-pay | Admitting: *Deleted

## 2013-09-27 NOTE — Telephone Encounter (Signed)
Pt asked if she could take her boot off to shower.  I told her no, the boot was to stay on, it was for protection and positioning, and not shower without someone responsible in the house.  Pt states understanding.

## 2013-10-01 ENCOUNTER — Encounter: Payer: BC Managed Care – PPO | Admitting: Podiatry

## 2013-10-02 NOTE — Progress Notes (Signed)
1) Austin bunionectomy left foot 2) Hammer toe repair 5th toe left foot

## 2013-10-03 ENCOUNTER — Encounter: Payer: BC Managed Care – PPO | Admitting: Podiatry

## 2013-10-03 ENCOUNTER — Encounter: Payer: Self-pay | Admitting: Podiatry

## 2013-10-03 ENCOUNTER — Ambulatory Visit (INDEPENDENT_AMBULATORY_CARE_PROVIDER_SITE_OTHER): Payer: BC Managed Care – PPO

## 2013-10-03 ENCOUNTER — Ambulatory Visit (INDEPENDENT_AMBULATORY_CARE_PROVIDER_SITE_OTHER): Payer: BC Managed Care – PPO | Admitting: Podiatry

## 2013-10-03 VITALS — BP 130/90 | HR 78 | Resp 12

## 2013-10-03 DIAGNOSIS — Z9889 Other specified postprocedural states: Secondary | ICD-10-CM

## 2013-10-03 DIAGNOSIS — M2010 Hallux valgus (acquired), unspecified foot: Secondary | ICD-10-CM

## 2013-10-03 NOTE — Patient Instructions (Signed)
In a seated position only may get surgical foot shower wet and redress as instructed. Continue to walk with boot on at all times.

## 2013-10-05 NOTE — Progress Notes (Signed)
Patient ID: Tammy GoldenCynthia R Moreno, female   DOB: [birth year], 53 y.o.   MRN: [id number]  Subjective: Patient presents postop Eliberto IvoryAustin bunionectomy and fifth hammertoe repair. The date of the surgery was 09/25/2013 and the procedures were performed by DR. Regal. Patient having minimal discomfort but still takes pain meds which controlled her pain.  Objective: Approximated surgical sites x2 with minimal edema, no erythema or drainage noted from the surgical sites. The surgical sites appear to have adequate alignment. There is no calf tenderness or edema left.  X-ray report weightbearing left foot  Evidence of first metatarsal osteotomy with retained cross wire K wires  with satisfactory alignment. Resection of proximal phalanx fifth digit left noted  Radiographic impression: Austin bunionectomy with satisfactory alignment Hammertoe repair fifth digit with satisfactory alignment  Assessment: Satisfactory postoperative progress radiographically clinically No evidence of infection or DVT  Plan: Reapply light dry sterile compression dressing. Patient will be allowed to get surgical sites shower when in the seated position. She was instructed to remove the boot in a seated position and dorsi and plantar flex the left foot several times a day for several minutes.  Reappoint x2 weeks for suture removal

## 2013-10-18 ENCOUNTER — Encounter: Payer: BC Managed Care – PPO | Admitting: Podiatry

## 2013-10-22 ENCOUNTER — Ambulatory Visit (INDEPENDENT_AMBULATORY_CARE_PROVIDER_SITE_OTHER): Payer: BC Managed Care – PPO

## 2013-10-22 ENCOUNTER — Ambulatory Visit (INDEPENDENT_AMBULATORY_CARE_PROVIDER_SITE_OTHER): Payer: BC Managed Care – PPO | Admitting: Podiatry

## 2013-10-22 ENCOUNTER — Encounter: Payer: BC Managed Care – PPO | Admitting: Podiatry

## 2013-10-22 VITALS — BP 125/77 | HR 82 | Resp 15 | Ht 65.0 in | Wt 140.0 lb

## 2013-10-22 DIAGNOSIS — M2010 Hallux valgus (acquired), unspecified foot: Secondary | ICD-10-CM

## 2013-10-22 DIAGNOSIS — Z9889 Other specified postprocedural states: Secondary | ICD-10-CM

## 2013-10-22 DIAGNOSIS — M2040 Other hammer toe(s) (acquired), unspecified foot: Secondary | ICD-10-CM

## 2013-10-22 NOTE — Progress Notes (Signed)
Pt presents for suture removal at POV2.  Pt's left 5th toe has little swelling and the suture line is intact.

## 2013-11-06 NOTE — Progress Notes (Signed)
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

## 2013-11-16 ENCOUNTER — Other Ambulatory Visit: Payer: Self-pay | Admitting: Obstetrics and Gynecology

## 2013-11-19 ENCOUNTER — Ambulatory Visit (INDEPENDENT_AMBULATORY_CARE_PROVIDER_SITE_OTHER): Payer: BC Managed Care – PPO

## 2013-11-19 ENCOUNTER — Ambulatory Visit (INDEPENDENT_AMBULATORY_CARE_PROVIDER_SITE_OTHER): Payer: BC Managed Care – PPO | Admitting: Podiatry

## 2013-11-19 ENCOUNTER — Encounter: Payer: BC Managed Care – PPO | Admitting: Podiatry

## 2013-11-19 VITALS — BP 121/70 | HR 77 | Resp 14 | Ht 65.5 in | Wt 138.0 lb

## 2013-11-19 DIAGNOSIS — Z9889 Other specified postprocedural states: Secondary | ICD-10-CM

## 2013-11-19 DIAGNOSIS — M2040 Other hammer toe(s) (acquired), unspecified foot: Secondary | ICD-10-CM

## 2013-11-19 DIAGNOSIS — M2010 Hallux valgus (acquired), unspecified foot: Secondary | ICD-10-CM

## 2013-11-19 NOTE — Progress Notes (Signed)
Pt states left 5th toe continues to swell, and she has burning beneath the ball of the foot when she walks a lot at work and walking the dog.

## 2013-11-20 NOTE — Progress Notes (Signed)
Subjective:     Patient ID: Tammy GoldenCynthia R Pless, female   DOB: 1961-06-22, 53 y.o.   MRN: [id number]  HPI patient presents stating my left foot is doing well after surgery but I am still getting some swelling especially in my fifth toe but wearing shoe gear at this time   Review of Systems     Objective:   Physical Exam Neurovascular status intact negative Homans sign noted with well-healing surgical sites of the first metatarsal left and fifth toe with wound edges coapted well and good range of motion    Assessment:     Healing well post surgery left foot with increased edema which may be due to excessive shoe gear usage    Plan:     Reviewed x-rays and explained to patient that swelling can occur and that white her tight shoes would probably be more appropriate at this time. Patient is healing well and has good structure but will be seen back in 4 weeks and may require injection in the future

## 2013-12-31 ENCOUNTER — Ambulatory Visit (INDEPENDENT_AMBULATORY_CARE_PROVIDER_SITE_OTHER): Payer: BC Managed Care – PPO | Admitting: Podiatry

## 2013-12-31 ENCOUNTER — Encounter: Payer: Self-pay | Admitting: Podiatry

## 2013-12-31 ENCOUNTER — Ambulatory Visit (INDEPENDENT_AMBULATORY_CARE_PROVIDER_SITE_OTHER): Payer: BC Managed Care – PPO

## 2013-12-31 VITALS — BP 118/77 | HR 64 | Resp 16

## 2013-12-31 DIAGNOSIS — M2010 Hallux valgus (acquired), unspecified foot: Secondary | ICD-10-CM

## 2013-12-31 DIAGNOSIS — M2040 Other hammer toe(s) (acquired), unspecified foot: Secondary | ICD-10-CM

## 2013-12-31 DIAGNOSIS — R609 Edema, unspecified: Secondary | ICD-10-CM

## 2014-01-01 NOTE — Progress Notes (Signed)
Subjective:     Patient ID: Tammy Moreno, female   DOB: [birth year], 53 y.o.   MRN: [id number] Patient states my left foot is doing well and walking with minimal discomfort HPI   Review of Systems     Objective:   Physical Exam Neurovascular status intact with well-healed surgical sites and good structural alignment with excellent range of motion    Assessment:     Doing very well post surgical intervention    Plan:     H&P and x-rays reviewed. Recommended continued activities to match the discomfort she experiences into gradually increase. Patient will be seen back to recheck and is given instructions for elevation compression

## 2014-01-22 ENCOUNTER — Ambulatory Visit (INDEPENDENT_AMBULATORY_CARE_PROVIDER_SITE_OTHER): Payer: Self-pay | Admitting: Sports Medicine

## 2014-01-22 ENCOUNTER — Encounter: Payer: Self-pay | Admitting: Sports Medicine

## 2014-01-22 VITALS — BP 129/87 | Ht 65.0 in | Wt 140.0 lb

## 2014-01-22 DIAGNOSIS — M25569 Pain in unspecified knee: Secondary | ICD-10-CM

## 2014-01-22 DIAGNOSIS — IMO0002 Reserved for concepts with insufficient information to code with codable children: Secondary | ICD-10-CM

## 2014-01-22 DIAGNOSIS — S83249A Other tear of medial meniscus, current injury, unspecified knee, initial encounter: Secondary | ICD-10-CM

## 2014-01-22 NOTE — Assessment & Plan Note (Signed)
This has improved enough clinically that I think she can do all normal activities and have no sign of mechanical symptoms  Compression sleeve does seem to be comfortable when she is doing more active work or exercises

## 2014-01-22 NOTE — Patient Instructions (Signed)
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

## 2014-01-22 NOTE — Assessment & Plan Note (Signed)
She has much less knee pain now unless she tries to use too much resistance or bend her knees too deeply  I suggested modifications to her exercise program but keep up biking  For hiking I think she should use I think sticks particularly when coming down hills  Avoid excess knee bend  Return to see me as needed
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
